# Patient Record
Sex: Female | Born: 1935
Health system: Southern US, Community
[De-identification: ages and names within clinical notes are randomized; demographics above are authoritative.]

---

## 2016-03-31 ENCOUNTER — Emergency Department (HOSPITAL_COMMUNITY)
Admission: EM | Admit: 2016-03-31 | Discharge: 2016-05-01 | Disposition: E | Payer: Self-pay | Attending: Emergency Medicine | Admitting: Emergency Medicine

## 2016-03-31 ENCOUNTER — Emergency Department (HOSPITAL_COMMUNITY): Payer: Self-pay

## 2016-03-31 DIAGNOSIS — Z5181 Encounter for therapeutic drug level monitoring: Secondary | ICD-10-CM | POA: Insufficient documentation

## 2016-03-31 DIAGNOSIS — R930 Abnormal findings on diagnostic imaging of skull and head, not elsewhere classified: Secondary | ICD-10-CM | POA: Insufficient documentation

## 2016-03-31 DIAGNOSIS — I959 Hypotension, unspecified: Secondary | ICD-10-CM | POA: Insufficient documentation

## 2016-03-31 LAB — URINALYSIS, MICROSCOPIC (REFLEX)

## 2016-03-31 LAB — CBC WITH DIFFERENTIAL/PLATELET
BASOS ABS: 0 10*3/uL (ref 0.0–0.1)
BASOS PCT: 0 %
EOS ABS: 0 10*3/uL (ref 0.0–0.7)
EOS PCT: 0 %
HCT: 44.7 % (ref 36.0–46.0)
Hemoglobin: 13.7 g/dL (ref 12.0–15.0)
Lymphocytes Relative: 20 %
Lymphs Abs: 0.9 10*3/uL (ref 0.7–4.0)
MCH: 27.9 pg (ref 26.0–34.0)
MCHC: 30.6 g/dL (ref 30.0–36.0)
MCV: 91 fL (ref 78.0–100.0)
MONO ABS: 0.6 10*3/uL (ref 0.1–1.0)
Monocytes Relative: 12 %
Neutro Abs: 3.2 10*3/uL (ref 1.7–7.7)
Neutrophils Relative %: 68 %
PLATELETS: 190 10*3/uL (ref 150–400)
RBC: 4.91 MIL/uL (ref 3.87–5.11)
RDW: 18 % — AB (ref 11.5–15.5)
WBC: 4.6 10*3/uL (ref 4.0–10.5)

## 2016-03-31 LAB — BLOOD GAS, ARTERIAL
Acid-base deficit: 3.3 mmol/L — ABNORMAL HIGH (ref 0.0–2.0)
Bicarbonate: 25.6 mmol/L (ref 20.0–28.0)
DRAWN BY: 24610
FIO2: 100
O2 Saturation: 97.6 %
PATIENT TEMPERATURE: 89
pCO2 arterial: 68.8 mmHg (ref 32.0–48.0)
pH, Arterial: 7.154 — CL (ref 7.350–7.450)
pO2, Arterial: 106 mmHg (ref 83.0–108.0)

## 2016-03-31 LAB — COMPREHENSIVE METABOLIC PANEL
ALT: 26 U/L (ref 14–54)
AST: 49 U/L — ABNORMAL HIGH (ref 15–41)
Albumin: 3.3 g/dL — ABNORMAL LOW (ref 3.5–5.0)
Alkaline Phosphatase: 64 U/L (ref 38–126)
Anion gap: 6 (ref 5–15)
BUN: 34 mg/dL — AB (ref 6–20)
CHLORIDE: 107 mmol/L (ref 101–111)
CO2: 27 mmol/L (ref 22–32)
CREATININE: 1.52 mg/dL — AB (ref 0.44–1.00)
Calcium: 9.6 mg/dL (ref 8.9–10.3)
GFR calc non Af Amer: 30 mL/min — ABNORMAL LOW (ref >60)
GFR, EST AFRICAN AMERICAN: 35 mL/min — AB (ref >60)
Glucose, Bld: 115 mg/dL — ABNORMAL HIGH (ref 65–99)
POTASSIUM: 5.9 mmol/L — AB (ref 3.5–5.1)
SODIUM: 140 mmol/L (ref 135–145)
Total Bilirubin: 1.5 mg/dL — ABNORMAL HIGH (ref 0.3–1.2)
Total Protein: 6.4 g/dL — ABNORMAL LOW (ref 6.5–8.1)

## 2016-03-31 LAB — URINALYSIS, ROUTINE W REFLEX MICROSCOPIC
GLUCOSE, UA: NEGATIVE mg/dL
KETONES UR: 15 mg/dL — AB
LEUKOCYTES UA: NEGATIVE
Nitrite: NEGATIVE
PH: 5.5 (ref 5.0–8.0)
Protein, ur: >300 mg/dL — AB
Specific Gravity, Urine: >1.03 — ABNORMAL HIGH (ref 1.005–1.030)

## 2016-03-31 LAB — I-STAT VENOUS BLOOD GAS, ED
ACID-BASE DEFICIT: 3 mmol/L — AB (ref 0.0–2.0)
Bicarbonate: 26.6 mmol/L (ref 20.0–28.0)
O2 SAT: 84 %
PCO2 VEN: 71.6 mmHg — AB (ref 44.0–60.0)
TCO2: 29 mmol/L (ref 0–100)
pH, Ven: 7.178 — CL (ref 7.250–7.430)
pO2, Ven: 62 mmHg — ABNORMAL HIGH (ref 32.0–45.0)

## 2016-03-31 LAB — PROTIME-INR
INR: 1.72
PROTHROMBIN TIME: 20.4 s — AB (ref 11.4–15.2)

## 2016-03-31 LAB — CBG MONITORING, ED: GLUCOSE-CAPILLARY: 82 mg/dL (ref 65–99)

## 2016-03-31 LAB — I-STAT TROPONIN, ED: TROPONIN I, POC: 0.03 ng/mL (ref 0.00–0.08)

## 2016-03-31 LAB — I-STAT CG4 LACTIC ACID, ED: Lactic Acid, Venous: 2.92 mmol/L (ref 0.5–1.9)

## 2016-03-31 LAB — TSH: TSH: 5.836 u[IU]/mL — ABNORMAL HIGH (ref 0.350–4.500)

## 2016-03-31 MED ORDER — SODIUM CHLORIDE 0.9 % IV BOLUS (SEPSIS)
500.0000 mL | Freq: Once | INTRAVENOUS | Status: AC
  Administered 2016-03-31: 500 mL via INTRAVENOUS

## 2016-03-31 MED ORDER — SODIUM CHLORIDE 0.9 % IV BOLUS (SEPSIS)
1000.0000 mL | Freq: Once | INTRAVENOUS | Status: AC
  Administered 2016-03-31: 1000 mL via INTRAVENOUS

## 2016-03-31 MED ORDER — NOREPINEPHRINE BITARTRATE 1 MG/ML IV SOLN
0.0000 ug/min | Freq: Once | INTRAVENOUS | Status: AC
  Administered 2016-03-31: 5 ug/min via INTRAVENOUS
  Filled 2016-03-31: qty 4

## 2016-03-31 MED ORDER — MORPHINE SULFATE (PF) 4 MG/ML IV SOLN: 2.0000 mg | INTRAVENOUS | Status: AC

## 2016-03-31 MED ORDER — KETAMINE HCL-SODIUM CHLORIDE 100-0.9 MG/10ML-% IV SOSY
PREFILLED_SYRINGE | INTRAVENOUS | Status: AC
  Filled 2016-03-31: qty 10

## 2016-03-31 MED ORDER — PIPERACILLIN-TAZOBACTAM 3.375 G IVPB: 3.3750 g | Freq: Three times a day (TID) | INTRAVENOUS | Status: DC

## 2016-03-31 MED ORDER — VANCOMYCIN HCL IN DEXTROSE 1-5 GM/200ML-% IV SOLN
1000.0000 mg | Freq: Once | INTRAVENOUS | Status: AC
  Administered 2016-03-31: 1000 mg via INTRAVENOUS
  Filled 2016-03-31: qty 200

## 2016-03-31 MED ORDER — PIPERACILLIN-TAZOBACTAM 3.375 G IVPB 30 MIN
3.3750 g | Freq: Once | INTRAVENOUS | Status: AC
  Administered 2016-03-31: 3.375 g via INTRAVENOUS
  Filled 2016-03-31: qty 50

## 2016-03-31 MED ORDER — VANCOMYCIN HCL 10 G IV SOLR: 1250.0000 mg | INTRAVENOUS | Status: DC

## 2016-04-01 LAB — BLOOD CULTURE ID PANEL (REFLEXED)
Acinetobacter baumannii: NOT DETECTED
Candida albicans: NOT DETECTED
Candida glabrata: NOT DETECTED
Candida krusei: NOT DETECTED
Candida parapsilosis: NOT DETECTED
Candida tropicalis: NOT DETECTED
ENTEROBACTER CLOACAE COMPLEX: NOT DETECTED
ENTEROCOCCUS SPECIES: NOT DETECTED
Enterobacteriaceae species: NOT DETECTED
Escherichia coli: NOT DETECTED
HAEMOPHILUS INFLUENZAE: NOT DETECTED
Klebsiella oxytoca: NOT DETECTED
Klebsiella pneumoniae: NOT DETECTED
LISTERIA MONOCYTOGENES: NOT DETECTED
METHICILLIN RESISTANCE: DETECTED — AB
NEISSERIA MENINGITIDIS: NOT DETECTED
PROTEUS SPECIES: NOT DETECTED
Pseudomonas aeruginosa: NOT DETECTED
SERRATIA MARCESCENS: NOT DETECTED
STAPHYLOCOCCUS AUREUS BCID: DETECTED — AB
STAPHYLOCOCCUS SPECIES: DETECTED — AB
STREPTOCOCCUS AGALACTIAE: NOT DETECTED
STREPTOCOCCUS SPECIES: NOT DETECTED
Streptococcus pneumoniae: NOT DETECTED
Streptococcus pyogenes: NOT DETECTED

## 2016-04-03 LAB — CULTURE, BLOOD (ROUTINE X 2)

## 2016-04-05 LAB — CULTURE, BLOOD (ROUTINE X 2): CULTURE: NO GROWTH

## 2016-05-01 NOTE — ED Notes (Signed)
Pt CBG was 82, notified Courtney(RN)

## 2016-05-01 NOTE — ED Provider Notes (Signed)
Patient seen with Dr. Franklyn Loruch.  Patient presented in extremis.  Discussed with next of kin and they wished nonaggressive end of life care with comfort measures.    I performed a history and physical examination of Wendy Hampton and discussed her management with Dr. Franklyn Loruch.  I agree with the history, physical, assessment, and plan of care, with the following exceptions: None  I was present for the following procedures: None Time Spent in Critical Care of the patient: None Time spent in discussions with the patient and family: [id number]  Wendy Hampton     Wendy Woodson, MD 04/17/16 [phone]

## 2016-05-01 NOTE — Progress Notes (Signed)
Pharmacy Antibiotic Note  Wendy Hampton is a 83 y.o. female admitted on 04/07/2016 with sepsis.  Pharmacy has been consulted for vancomycin and zosyn dosing. Pt is hypothermic and WBC is WNL. Scr is elevated at 1.52. Lactic acid is also elevated. First doses per EDP.   Plan: Vancomycin 1250mg  IV Q24H Zosyn 3.375mg  IV Q8H (4 hr inf) F/u renal fxn, C&S, clinical status and trough at SS  Height: 5\' 7"  (170.2 cm) Weight: 190 lb (86.2 kg) IBW/kg (Calculated) : 61.6  Temp (24hrs), Avg:90 F (32.2 C), Min:90 F (32.2 C), Max:90 F (32.2 C)   Recent Labs Lab 05-24-16 1628  LATICACIDVEN 2.92*    CrCl cannot be calculated (No order found.).    Allergies not on file  Antimicrobials this admission: Vanc 3/1>> Zosyn 3/1>>  Dose adjustments this admission: N/A  Microbiology results: Pending  Thank you for allowing pharmacy to be a part of this patient's care.  Gautam Langhorst, Drake LeachRachel Lynn 04/13/2016 4:42 PM

## 2016-05-01 NOTE — ED Notes (Signed)
Increased Norepinephrine to 8315mcq/min

## 2016-05-01 NOTE — Progress Notes (Signed)
ABG results given to Dr Rosalia Hammersay.  Per MD, pt is DNR/DNI and is also not a candidate for bipap currently d/t mental status.  No new RT orders received at this time.

## 2016-05-01 NOTE — ED Notes (Signed)
MD spoke with POA and he stated that pt is DNR/DNI but does want pressors and antibiotics until he gets here.

## 2016-05-01 NOTE — ED Notes (Signed)
[facility] and security at this time

## 2016-05-01 NOTE — ED Provider Notes (Signed)
[facility]-EMERGENCY DEPT Provider Note   CSN: [id number] Arrival date & time: April 15, 2016  1539     History   Chief Complaint Chief Complaint  Patient presents with  . Respiratory Distress    HPI Wendy Hampton is a 83 y.o. female.  HPI Arrives via EMS from home Family states today she woke up weak but was still responsive Niece who is her caregiver checks her blood pressure noted to be low She thought she was just tired so they allowed her to rest But around 10 AM she still is acting very sleepy They decided to call EMS in the afternoon When EMS arrived, she was hypotensive to the 80s systolic EKG was paced but without evidence of acute ischemia She was given fluids and transported without event Her sugar was normal She was minimally responsive and placed on a nonrebreather, pulse oximetry was not reliable and did not give consistent readings History Limited secondary to altered mental status  No past medical history on file.  There are no active problems to display for this patient.   No past surgical history on file.  OB History    No data available       Home Medications    Prior to Admission medications   Not on File    Family History No family history on file.  Social History Social History  Substance Use Topics  . Smoking status: Not on file  . Smokeless tobacco: Not on file  . Alcohol use Not on file     Allergies   Patient has no allergy information on record.   Review of Systems Review of Systems  Unable to perform ROS: Acuity of condition     Physical Exam Updated Vital Signs BP (!) 61/46   Pulse (!) 57   Temp (!) 90 F (32.2 C) (Rectal) Comment: 89.7  Resp (!) 0   Ht 5\' 7"  (1.702 m)   Wt 86.2 kg   SpO2 100%   BMI 29.76 kg/m   Physical Exam  Constitutional: She appears well-developed and well-nourished. She appears distressed.  HENT:  Head: Normocephalic and atraumatic.  Eyes: Conjunctivae are normal.  Neck: Neck supple.    Cardiovascular:  No murmur heard. Paced, regular rhythm  Pulmonary/Chest: Breath sounds normal. No respiratory distress.  Decreased effort, on NRB but sating 100%  Abdominal: Soft. There is no tenderness.  Musculoskeletal: She exhibits edema.  Neurological:  Obtunded but does cough, swallow and open eyes to pain Unable to assess movement of all four extremities  Skin: Skin is warm and dry. Capillary refill takes more than 3 seconds.  Nursing note and vitals reviewed.    ED Treatments / Results  Labs (all labs ordered are listed, but only abnormal results are displayed) Labs Reviewed  CBC WITH DIFFERENTIAL/PLATELET - Abnormal; Notable for the following:       Result Value   RDW 18.0 (*)    All other components within normal limits  PROTIME-INR - Abnormal; Notable for the following:    Prothrombin Time 20.4 (*)    All other components within normal limits  URINALYSIS, ROUTINE W REFLEX MICROSCOPIC - Abnormal; Notable for the following:    Specific Gravity, Urine >1.030 (*)    Hgb urine dipstick TRACE (*)    Bilirubin Urine SMALL (*)    Ketones, ur 15 (*)    Protein, ur >300 (*)    All other components within normal limits  TSH - Abnormal; Notable for the following:    TSH  5.836 (*)    All other components within normal limits  BLOOD GAS, ARTERIAL - Abnormal; Notable for the following:    pH, Arterial 7.154 (*)    pCO2 arterial 68.8 (*)    Acid-base deficit 3.3 (*)    All other components within normal limits  COMPREHENSIVE METABOLIC PANEL - Abnormal; Notable for the following:    Potassium 5.9 (*)    Glucose, Bld 115 (*)    BUN 34 (*)    Creatinine, Ser 1.52 (*)    Total Protein 6.4 (*)    Albumin 3.3 (*)    AST 49 (*)    Total Bilirubin 1.5 (*)    GFR calc non Af Amer 30 (*)    GFR calc Af Amer 35 (*)    All other components within normal limits  URINALYSIS, MICROSCOPIC (REFLEX) - Abnormal; Notable for the following:    Bacteria, UA RARE (*)    Squamous  Epithelial / LPF 0-5 (*)    All other components within normal limits  I-STAT VENOUS BLOOD GAS, ED - Abnormal; Notable for the following:    pH, Ven 7.178 (*)    pCO2, Ven 71.6 (*)    pO2, Ven 62.0 (*)    Acid-base deficit 3.0 (*)    All other components within normal limits  I-STAT CG4 LACTIC ACID, ED - Abnormal; Notable for the following:    Lactic Acid, Venous 2.92 (*)    All other components within normal limits  CULTURE, BLOOD (ROUTINE X 2)  CULTURE, BLOOD (ROUTINE X 2)  CBG MONITORING, ED  I-STAT TROPOININ, ED    EKG  EKG Interpretation None       Radiology Ct Head Wo Contrast  Result Date: 04/14/2016 CLINICAL DATA:  Unresponsive, trauma EXAM: CT HEAD WITHOUT CONTRAST TECHNIQUE: Contiguous axial images were obtained from the base of the skull through the vertex without intravenous contrast. COMPARISON:  None. FINDINGS: Brain: Small subdural low-density fluid collection noted over the high left parietal lobe measuring 8 mm in thickness compatible with chronic subdural hematoma or subdural hygroma. Slight left-to-right midline shift of 2 mm. No intraparenchymal hemorrhage or acute infarction. No hydrocephalus. Vascular: No hyperdense vessel or unexpected calcification. Skull: No acute calvarial abnormality. Sinuses/Orbits: Visualized paranasal sinuses and mastoids clear. Orbital soft tissues unremarkable. 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 Electronically Signed   By: Charlett NoseKevin  Dover M.D.   On: 15-Mar-2016 18:26   Dg Chest Portable 1 View  Result Date: 04/13/2016 CLINICAL DATA:  Unresponsive. EXAM: PORTABLE CHEST 1 VIEW COMPARISON:  No recent prior. FINDINGS: Cardiac pacer noted with lead tip over the right ventricle. Prominent cardiomegaly. No pulmonary venous congestion. Prominent bilateral pleural effusions. Diffuse right lung right  infiltrate and/or asymmetric edema . No acute bony abnormality IMPRESSION: 1. Cardiac pacer noted with lead tip over the right ventricle. Severe cardiomegaly. 2. Diffuse mild right lung infiltrate and/or asymmetry edema. Prominent bilateral pleural effusions, right side greater than left. Electronically Signed   By: Maisie Fushomas  Register   On: 15-Mar-2016 16:37    Procedures Procedures (including critical care time)  EMERGENCY DEPARTMENT ULTRASOUND  Study: Limited Retroperitoneal Ultrasound of the Abdominal Aorta.  INDICATIONS:Abnormal vital signs Multiple views of the abdominal aorta were obtained in real-time from the diaphragmatic hiatus to the aortic bifurcation in transverse planes with a multi-frequency probe.  PERFORMED BY: Myself IMAGES ARCHIVED?: Yes LIMITATIONS:  Body habitus INTERPRETATION:  No abdominal aortic aneurysm    EMERGENCY DEPARTMENT [place] CARDIAC EXAM "Study: Limited Ultrasound of the Heart and Pericardium"  INDICATIONS:Abnormal vital signs Multiple views of the heart and pericardium were obtained in real-time with a multi-frequency probe.  PERFORMED DG:UYQIHK IMAGES ARCHIVED?: Yes LIMITATIONS:  Body habitus VIEWS USED: Subcostal 4 chamber and Inferior Vena Cava INTERPRETATION: Cardiac activity present, Pericardial effusioin absent, Probable elevated CVP, Decreased contractility and IVC dilated  EMERGENCY DEPARTMENT  US GUIDANCE EXAM Emergency Ultrasound:  US Guidance for Needle Guidance  INDICATIONS: Difficult vascular access Linear probe used in real-time to visualize location of needle entry through skin.   PERFORMED BY: Myself IMAGES ARCHIVED?: No LIMITATIONS: None VIEWS USED: Transverse INTERPRETATION: Needle visualized within vein  Medications Ordered in ED Medications  morphine 4 MG/ML injection 2 mg (2 mg Intravenous Not Given 04-10-2016 1937)  piperacillin-tazobactam (ZOSYN) IVPB 3.375 g (0 g Intravenous Stopped 04-10-2016 1700)  vancomycin (VANCOCIN) IVPB  1000 mg/200 mL premix (0 mg Intravenous Stopped 04/10/2016 1730)  norepinephrine (LEVOPHED) 4 mg in dextrose 5 % 250 mL (0.016 mg/mL) infusion (0 mcg/min Intravenous Stopped 04/10/16 1939)  sodium chloride 0.9 % bolus 1,000 mL (0 mLs Intravenous Stopped Apr 10, 2016 1635)  sodium chloride 0.9 % bolus 500 mL (0 mLs Intravenous Stopped 04/10/2016 1929)     Initial Impression / Assessment and Plan / ED Course  I have reviewed the triage vital signs and the nursing notes.  Pertinent labs & imaging results that were available during my care of the patient were reviewed by me and considered in my medical decision making (see chart for details).     Upon arrival, pt coughing but concern for protection of airway - son at bedside who lives with pt states aggressive measures not what he believes pt should have but he is not POA  CTH with likely chronic findings - SDH vs cyst  [facility] [place] with severe CHF but no obvious other source of HoTN Hypothermic, ill appearing - abx started empirically - UA pending, CXR wo obvious PNA  Pt continues hypotensive, however, IVC with high fluids - s/p additional fluids here despite this but no improvement - pressors started  Initial chemistry is hemolyzed, pending repeat  VBG with acidosis, hypercapnea  4:55p - Discussed with POA, Teodora Medici at [phone], he does not want pt to be intubated, CPR/shock. However, he is ok to temporize pt with pressors/abx while we await for him to arrive.  With family at bedside, pt had escalating doses of pressors and the decision was made to withdraw care. However, before the pressors could even be turned off, pt became more and more hypotensive and went into cardiac arrest.   7:37p - time of death - pt not breathing, no heart sounds auscultated, eyes without corneal reflex. Family at bedside understands and no further questions.   Spoke with Dr. Doreatha Martin, from PCP, [phone], he will notify Dr. Marcene Corning and they will fill death  certificate.   Final Clinical Impressions(s) / ED Diagnoses   Final diagnoses:  Hypotension, unspecified hypotension type    New Prescriptions There are no discharge medications for this patient.    Sidney Ace, MD 04/01/16 8416    Margarita Grizzle, MD 04/01/16 [phone]

## 2016-05-01 NOTE — ED Notes (Signed)
Informed RN Mikey CollegeKelvin of lactic acid result he will inform EDP

## 2016-05-01 NOTE — ED Notes (Signed)
Provider in room with pt who is POA discussing treatment options.

## 2016-05-01 NOTE — ED Notes (Signed)
Portable xray at bedside.

## 2016-05-01 NOTE — ED Triage Notes (Signed)
Pt presents to the ed with ems unresponsive, found by family only responsive to pain, breathing is shallow, last seen normal at 0730 by family, pt has weak thready pulses receiving a bolus of ns from ems, edp at bedside

## 2016-05-01 DEATH — deceased

## 2018-09-24 IMAGING — CT CT HEAD W/O CM
4 series · 22 of 47 positions shown, 24 images · non-contrast
Comparison: None.

CLINICAL DATA: Unresponsive, trauma

EXAM:
CT HEAD WITHOUT CONTRAST
TECHNIQUE: Contiguous axial images were obtained from the base of the skull
through the vertex without intravenous contrast.

[Series 203: coronal st, idose (1) · coronal · 0.39mm/px · 3 of 67 slices shown]
[im 23/67  brain]
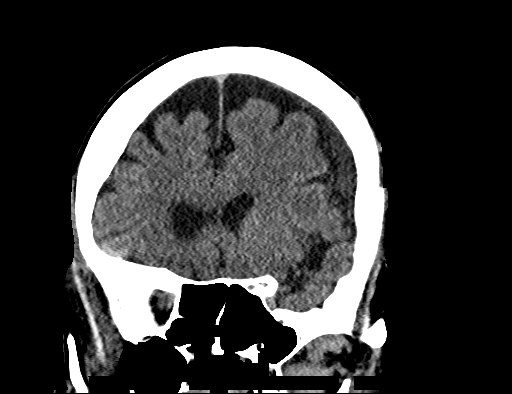
[im 30/67  brain]
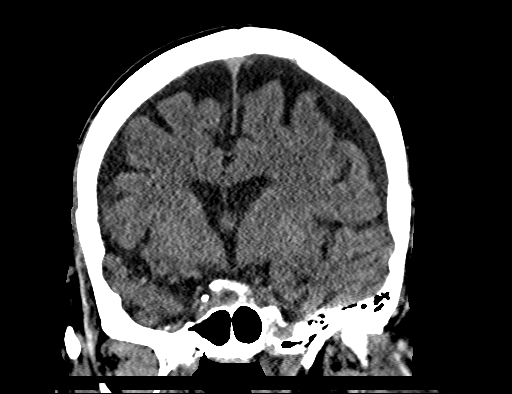
[im 37/67  brain]
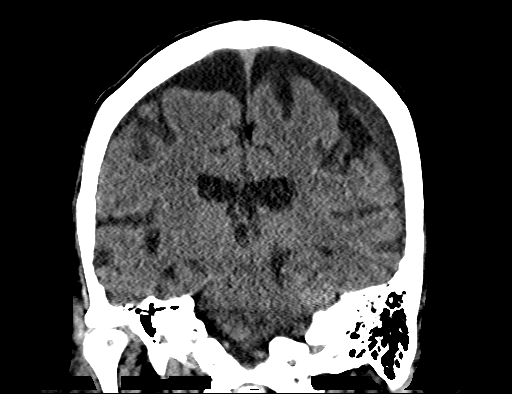

[Series 204: sagittal st, idose (1) · sagittal · 0.39mm/px · 3 of 67 slices shown]
[im 23/67  brain]
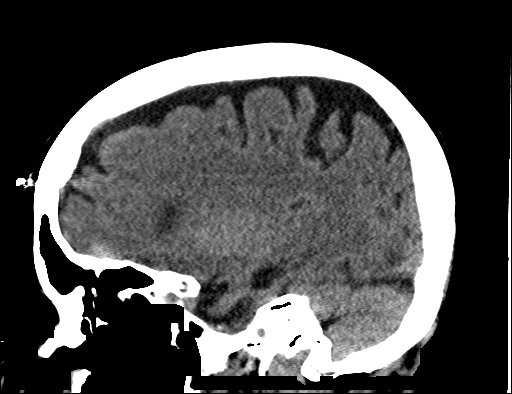
[im 34/67  brain]
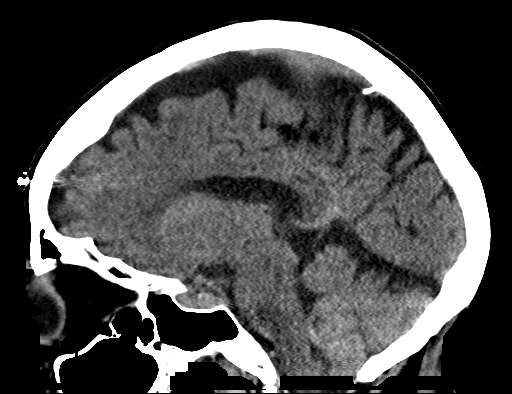
[im 45/67  brain]
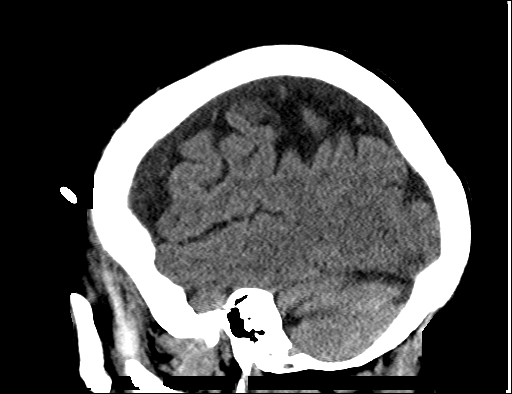

[Series 205: head w/o, idose (1) · axial · non-contrast · 0.48mm/px · z∈[+103,+208]mm · 8 of 29 slices shown, 10 images]
[im 4/29  brain]
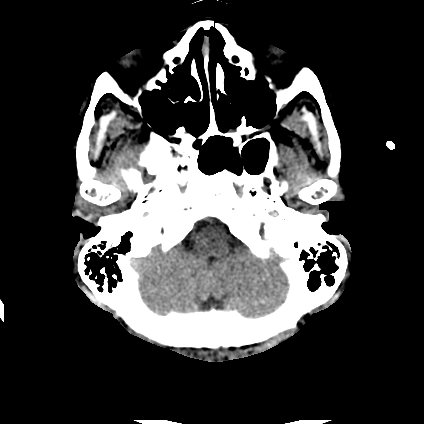
[im 4/29  bone]
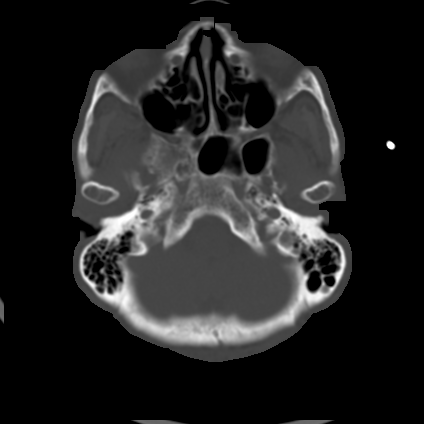
[im 7/29  brain]
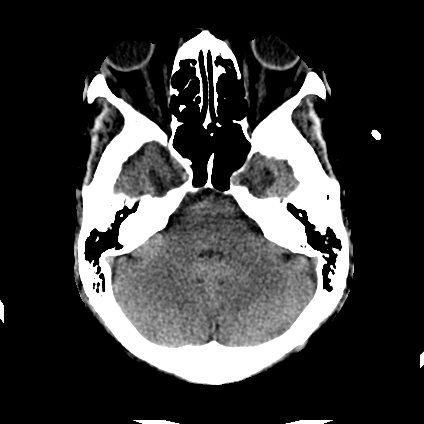
[im 10/29  brain]
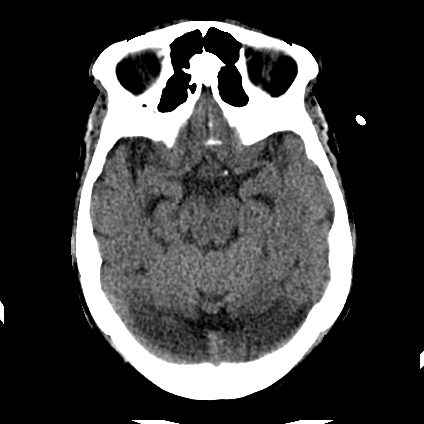
[im 13/29  brain]
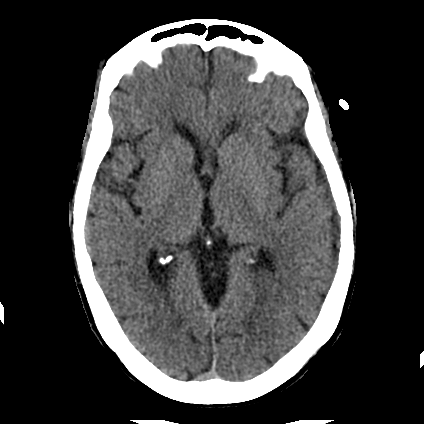
[im 16/29  brain]
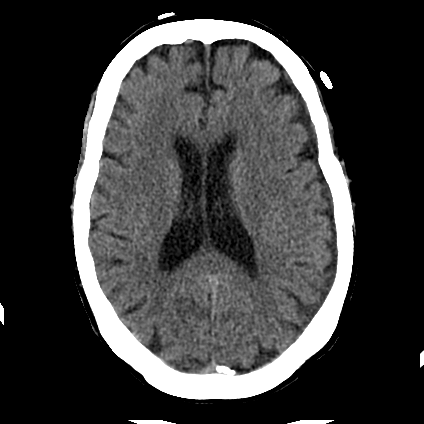
[im 16/29  bone]
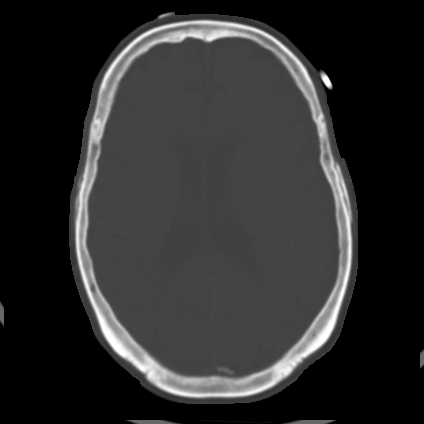
[im 19/29  brain]
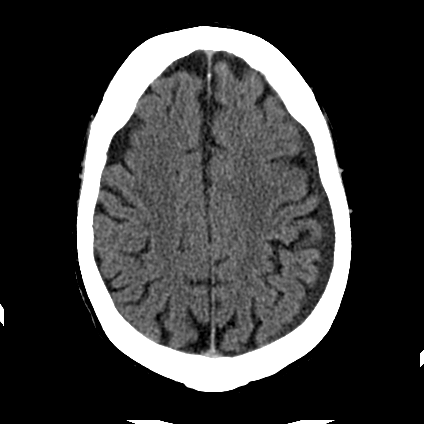
[im 22/29  brain]
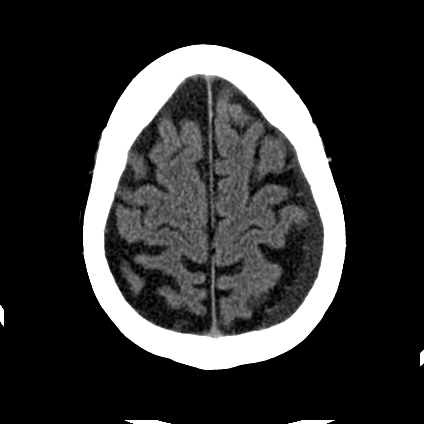
[im 25/29  brain]
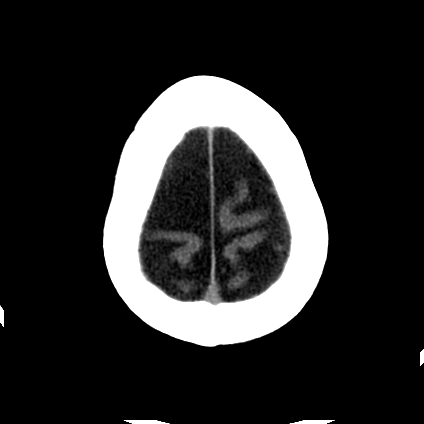

[Series 206: head w/o bone, idose (1) · axial · non-contrast · 0.47mm/px · z∈[+102,+217]mm · 8 of 60 slices shown]
[im 7/60  bone]
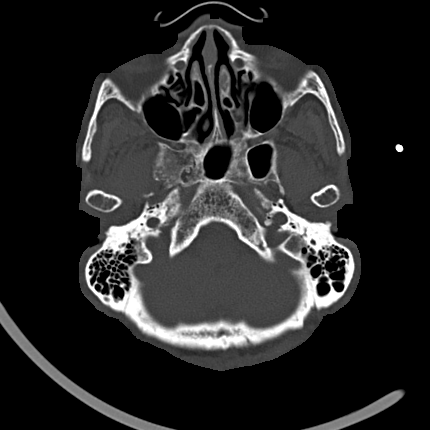
[im 13/60  bone]
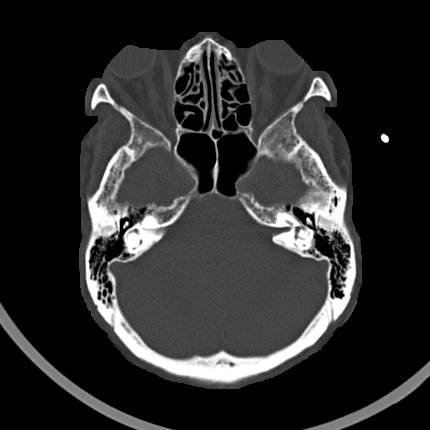
[im 19/60  bone]
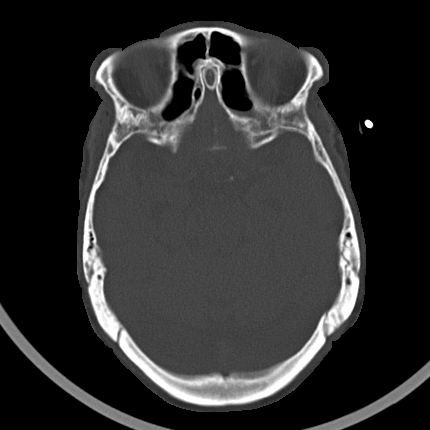
[im 25/60  bone]
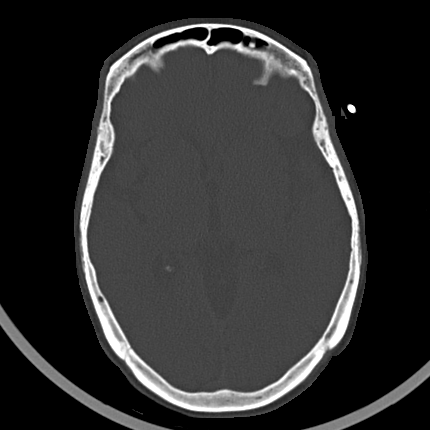
[im 35/60  bone]
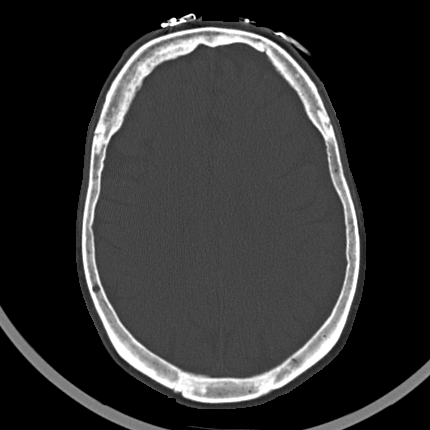
[im 41/60  bone]
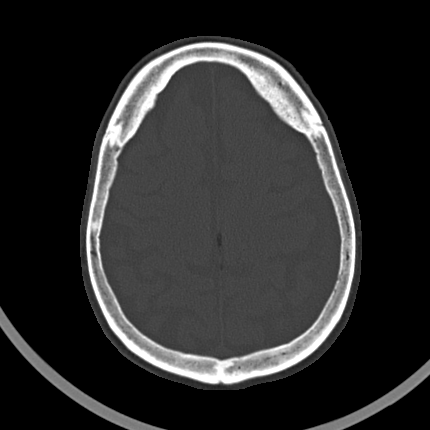
[im 47/60  bone]
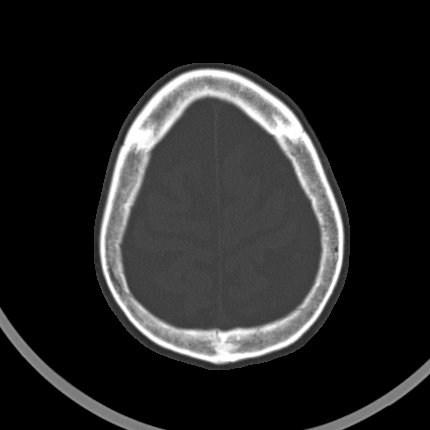
[im 53/60  bone]
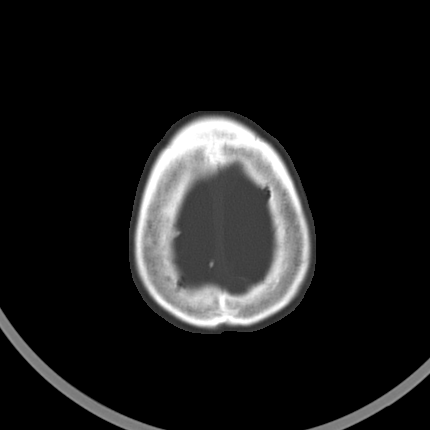

[22 of 47 positions shown; findings below may reference images not displayed]

FINDINGS: Brain: Small subdural low-density fluid collection noted over the
high left parietal lobe measuring 8 mm in thickness compatible with
chronic subdural hematoma or subdural hygroma. Slight left-to-right
midline shift of 2 mm. No intraparenchymal hemorrhage or acute
infarction. No hydrocephalus.

Vascular: No hyperdense vessel or unexpected calcification.

Skull: No acute calvarial abnormality.

Sinuses/Orbits: Visualized paranasal sinuses and mastoids clear.
Orbital soft tissues unremarkable.

Other: None
IMPRESSION: Small low-density subdural fluid collection on the left compatible
with small chronic subdural hematoma or subdural hygroma with 2 mm
of left-to-right midline shift.

## 2018-09-24 IMAGING — CR DG CHEST 1V PORT
1 series · 1 of 1 positions shown · non-contrast
Comparison: No recent prior.

CLINICAL DATA: Unresponsive.

EXAM:
PORTABLE CHEST 1 VIEW

[ap portable]
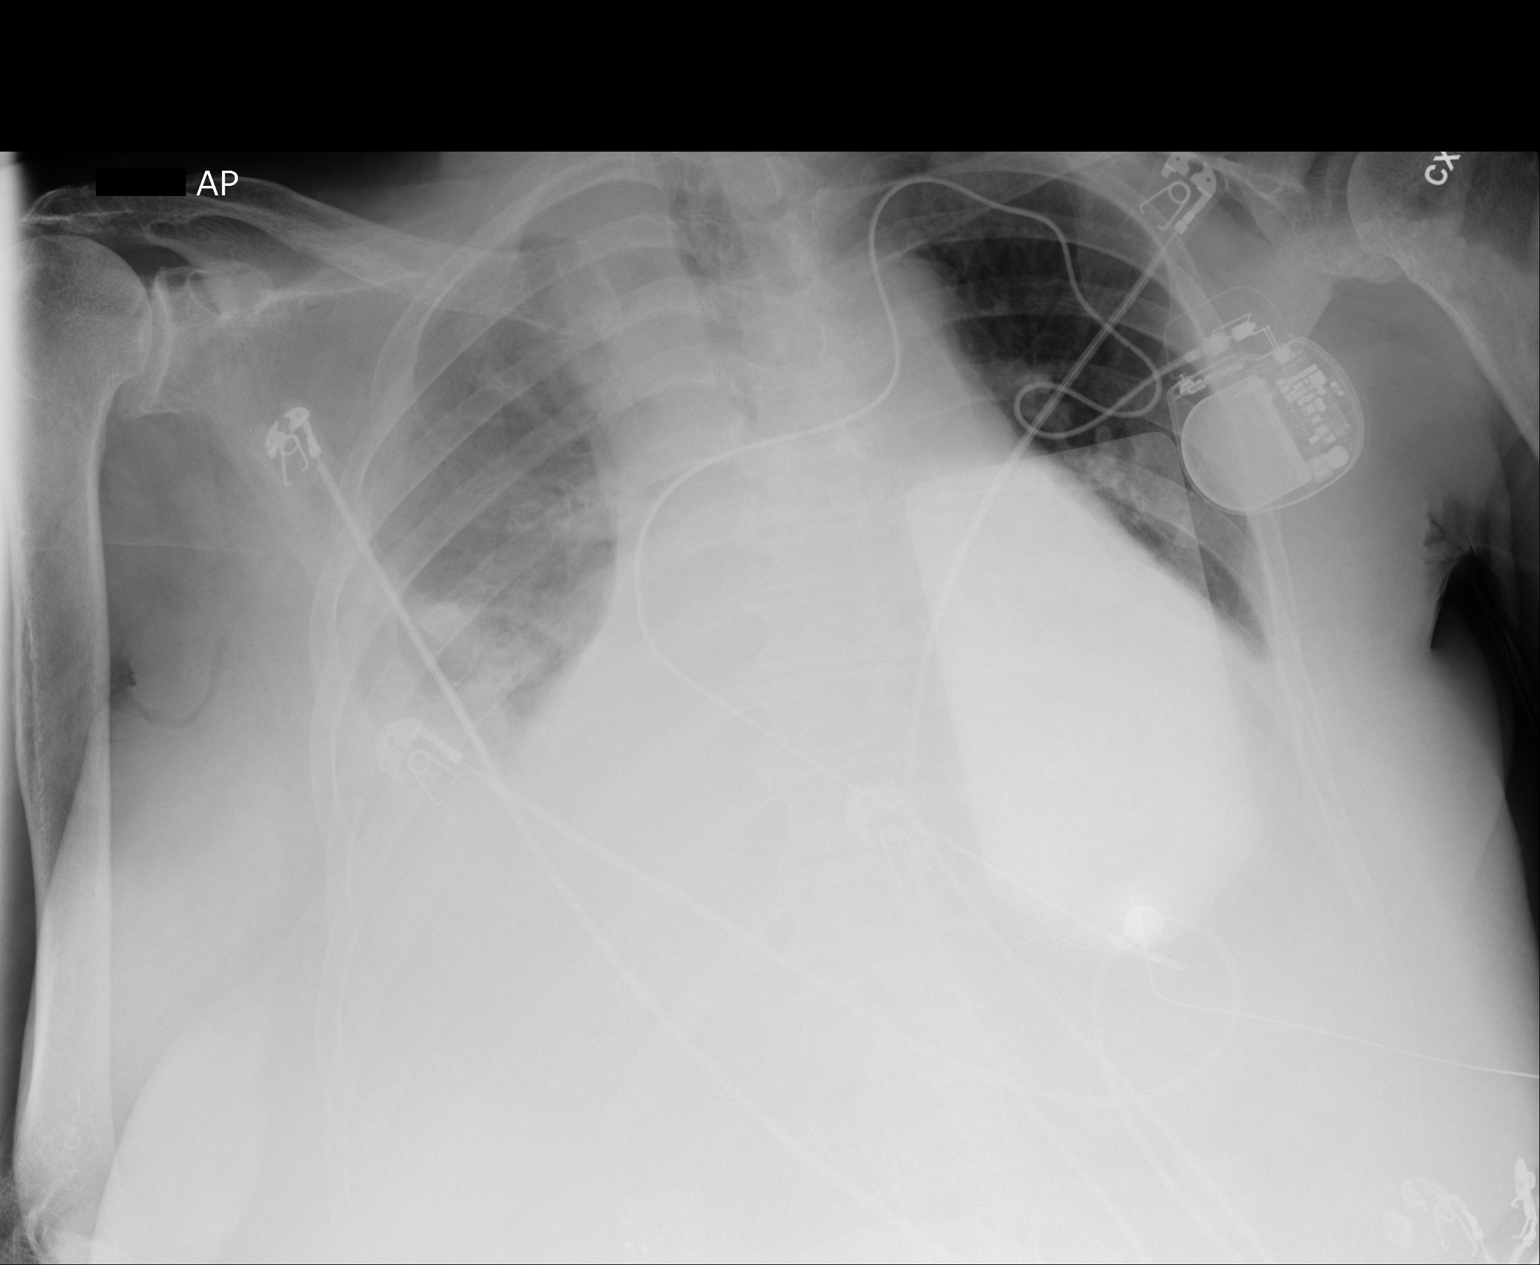

[1 of 1 positions shown; findings below may reference images not displayed]

FINDINGS: Cardiac pacer noted with lead tip over the right ventricle.
Prominent cardiomegaly. No pulmonary venous congestion. Prominent
bilateral pleural effusions. Diffuse right lung right infiltrate
and/or asymmetric edema . No acute bony abnormality
IMPRESSION: 1. Cardiac pacer noted with lead tip over the right ventricle.
Severe cardiomegaly.

2. Diffuse mild right lung infiltrate and/or asymmetry edema.
Prominent bilateral pleural effusions, right side greater than left.
